# Patient Record
Sex: Female | Born: 1980 | Race: Black or African American | Hispanic: No | Marital: Married
Health system: Southern US, Community
[De-identification: ages and names within clinical notes are randomized; demographics above are authoritative.]

---

## 2020-10-30 DIAGNOSIS — Z419 Encounter for procedure for purposes other than remedying health state, unspecified: Secondary | ICD-10-CM | POA: Diagnosis not present

## 2020-11-30 DIAGNOSIS — Z419 Encounter for procedure for purposes other than remedying health state, unspecified: Secondary | ICD-10-CM | POA: Diagnosis not present

## 2020-12-26 DIAGNOSIS — Z114 Encounter for screening for human immunodeficiency virus [HIV]: Secondary | ICD-10-CM | POA: Diagnosis not present

## 2020-12-26 DIAGNOSIS — Z23 Encounter for immunization: Secondary | ICD-10-CM | POA: Diagnosis not present

## 2020-12-26 DIAGNOSIS — Z049 Encounter for examination and observation for unspecified reason: Secondary | ICD-10-CM | POA: Diagnosis not present

## 2020-12-31 DIAGNOSIS — Z419 Encounter for procedure for purposes other than remedying health state, unspecified: Secondary | ICD-10-CM | POA: Diagnosis not present

## 2021-01-08 DIAGNOSIS — Z13220 Encounter for screening for lipoid disorders: Secondary | ICD-10-CM | POA: Diagnosis not present

## 2021-01-08 DIAGNOSIS — Z130 Encounter for screening for diseases of the blood and blood-forming organs and certain disorders involving the immune mechanism: Secondary | ICD-10-CM | POA: Diagnosis not present

## 2021-01-08 DIAGNOSIS — R221 Localized swelling, mass and lump, neck: Secondary | ICD-10-CM | POA: Diagnosis not present

## 2021-01-08 DIAGNOSIS — Z0189 Encounter for other specified special examinations: Secondary | ICD-10-CM | POA: Diagnosis not present

## 2021-01-08 DIAGNOSIS — Z1329 Encounter for screening for other suspected endocrine disorder: Secondary | ICD-10-CM | POA: Diagnosis not present

## 2021-01-08 DIAGNOSIS — Z131 Encounter for screening for diabetes mellitus: Secondary | ICD-10-CM | POA: Diagnosis not present

## 2021-01-15 ENCOUNTER — Other Ambulatory Visit: Payer: Self-pay | Admitting: Family Medicine

## 2021-01-15 DIAGNOSIS — R221 Localized swelling, mass and lump, neck: Secondary | ICD-10-CM

## 2021-01-27 ENCOUNTER — Ambulatory Visit (HOSPITAL_COMMUNITY)
Admission: RE | Admit: 2021-01-27 | Discharge: 2021-01-27 | Disposition: A | Discharge status: Home / Self Care | Payer: Medicaid Other | Source: Ambulatory Visit | Attending: Family Medicine | Admitting: Family Medicine

## 2021-01-27 ENCOUNTER — Other Ambulatory Visit: Payer: Self-pay

## 2021-01-27 DIAGNOSIS — R221 Localized swelling, mass and lump, neck: Secondary | ICD-10-CM | POA: Insufficient documentation

## 2021-01-27 DIAGNOSIS — R220 Localized swelling, mass and lump, head: Secondary | ICD-10-CM | POA: Diagnosis not present

## 2021-01-27 DIAGNOSIS — E041 Nontoxic single thyroid nodule: Secondary | ICD-10-CM | POA: Diagnosis not present

## 2021-01-28 DIAGNOSIS — Z419 Encounter for procedure for purposes other than remedying health state, unspecified: Secondary | ICD-10-CM | POA: Diagnosis not present

## 2021-01-29 ENCOUNTER — Other Ambulatory Visit: Payer: Self-pay

## 2021-01-29 ENCOUNTER — Other Ambulatory Visit: Payer: Self-pay | Admitting: Family Medicine

## 2021-01-29 DIAGNOSIS — R221 Localized swelling, mass and lump, neck: Secondary | ICD-10-CM

## 2021-01-30 ENCOUNTER — Other Ambulatory Visit: Payer: Self-pay | Admitting: Family Medicine

## 2021-01-30 DIAGNOSIS — R221 Localized swelling, mass and lump, neck: Secondary | ICD-10-CM

## 2021-02-11 DIAGNOSIS — Z655 Exposure to disaster, war and other hostilities: Secondary | ICD-10-CM | POA: Diagnosis not present

## 2021-02-11 DIAGNOSIS — Z01419 Encounter for gynecological examination (general) (routine) without abnormal findings: Secondary | ICD-10-CM | POA: Diagnosis not present

## 2021-02-11 DIAGNOSIS — Z0000 Encounter for general adult medical examination without abnormal findings: Secondary | ICD-10-CM | POA: Diagnosis not present

## 2021-02-11 DIAGNOSIS — Z111 Encounter for screening for respiratory tuberculosis: Secondary | ICD-10-CM | POA: Diagnosis not present

## 2021-02-11 DIAGNOSIS — Z124 Encounter for screening for malignant neoplasm of cervix: Secondary | ICD-10-CM | POA: Diagnosis not present

## 2021-02-11 DIAGNOSIS — R221 Localized swelling, mass and lump, neck: Secondary | ICD-10-CM | POA: Diagnosis not present

## 2021-02-14 ENCOUNTER — Inpatient Hospital Stay: Admission: RE | Admit: 2021-02-14 | Payer: Medicaid Other | Source: Ambulatory Visit

## 2021-02-14 ENCOUNTER — Other Ambulatory Visit: Payer: Self-pay

## 2021-02-25 DIAGNOSIS — Z7689 Persons encountering health services in other specified circumstances: Secondary | ICD-10-CM | POA: Diagnosis not present

## 2021-02-28 DIAGNOSIS — Z419 Encounter for procedure for purposes other than remedying health state, unspecified: Secondary | ICD-10-CM | POA: Diagnosis not present

## 2021-03-03 ENCOUNTER — Inpatient Hospital Stay: Admission: RE | Admit: 2021-03-03 | Payer: Medicaid Other | Source: Ambulatory Visit

## 2021-03-03 DIAGNOSIS — Z7689 Persons encountering health services in other specified circumstances: Secondary | ICD-10-CM | POA: Diagnosis not present

## 2021-03-17 ENCOUNTER — Inpatient Hospital Stay: Admission: RE | Admit: 2021-03-17 | Payer: Medicaid Other | Source: Ambulatory Visit

## 2021-03-30 DIAGNOSIS — Z419 Encounter for procedure for purposes other than remedying health state, unspecified: Secondary | ICD-10-CM | POA: Diagnosis not present

## 2021-04-03 ENCOUNTER — Other Ambulatory Visit: Payer: Self-pay

## 2021-04-03 ENCOUNTER — Ambulatory Visit
Admission: RE | Admit: 2021-04-03 | Discharge: 2021-04-03 | Disposition: A | Discharge status: Home / Self Care | Payer: No Typology Code available for payment source | Source: Ambulatory Visit | Attending: Obstetrics and Gynecology | Admitting: Obstetrics and Gynecology

## 2021-04-03 ENCOUNTER — Other Ambulatory Visit: Payer: Self-pay | Admitting: Obstetrics and Gynecology

## 2021-04-03 DIAGNOSIS — Z0000 Encounter for general adult medical examination without abnormal findings: Secondary | ICD-10-CM

## 2021-04-30 DIAGNOSIS — Z419 Encounter for procedure for purposes other than remedying health state, unspecified: Secondary | ICD-10-CM | POA: Diagnosis not present

## 2021-05-26 DIAGNOSIS — R221 Localized swelling, mass and lump, neck: Secondary | ICD-10-CM | POA: Diagnosis not present

## 2021-05-26 DIAGNOSIS — E049 Nontoxic goiter, unspecified: Secondary | ICD-10-CM | POA: Diagnosis not present

## 2021-05-26 DIAGNOSIS — R590 Localized enlarged lymph nodes: Secondary | ICD-10-CM | POA: Diagnosis not present

## 2021-05-30 DIAGNOSIS — Z419 Encounter for procedure for purposes other than remedying health state, unspecified: Secondary | ICD-10-CM | POA: Diagnosis not present

## 2021-06-03 DIAGNOSIS — E049 Nontoxic goiter, unspecified: Secondary | ICD-10-CM | POA: Diagnosis not present

## 2021-06-03 DIAGNOSIS — R590 Localized enlarged lymph nodes: Secondary | ICD-10-CM | POA: Diagnosis not present

## 2021-06-26 DIAGNOSIS — R590 Localized enlarged lymph nodes: Secondary | ICD-10-CM | POA: Diagnosis not present

## 2021-06-30 DIAGNOSIS — Z419 Encounter for procedure for purposes other than remedying health state, unspecified: Secondary | ICD-10-CM | POA: Diagnosis not present

## 2021-07-31 DIAGNOSIS — Z419 Encounter for procedure for purposes other than remedying health state, unspecified: Secondary | ICD-10-CM | POA: Diagnosis not present

## 2021-08-30 DIAGNOSIS — Z419 Encounter for procedure for purposes other than remedying health state, unspecified: Secondary | ICD-10-CM | POA: Diagnosis not present

## 2021-09-30 DIAGNOSIS — Z419 Encounter for procedure for purposes other than remedying health state, unspecified: Secondary | ICD-10-CM | POA: Diagnosis not present

## 2021-10-02 DIAGNOSIS — R590 Localized enlarged lymph nodes: Secondary | ICD-10-CM | POA: Diagnosis not present

## 2021-10-03 DIAGNOSIS — Z23 Encounter for immunization: Secondary | ICD-10-CM | POA: Diagnosis not present

## 2021-10-30 DIAGNOSIS — Z419 Encounter for procedure for purposes other than remedying health state, unspecified: Secondary | ICD-10-CM | POA: Diagnosis not present

## 2021-11-30 DIAGNOSIS — Z419 Encounter for procedure for purposes other than remedying health state, unspecified: Secondary | ICD-10-CM | POA: Diagnosis not present

## 2021-12-31 DIAGNOSIS — Z419 Encounter for procedure for purposes other than remedying health state, unspecified: Secondary | ICD-10-CM | POA: Diagnosis not present

## 2022-01-28 DIAGNOSIS — Z419 Encounter for procedure for purposes other than remedying health state, unspecified: Secondary | ICD-10-CM | POA: Diagnosis not present

## 2022-02-28 DIAGNOSIS — Z419 Encounter for procedure for purposes other than remedying health state, unspecified: Secondary | ICD-10-CM | POA: Diagnosis not present

## 2022-03-30 DIAGNOSIS — Z419 Encounter for procedure for purposes other than remedying health state, unspecified: Secondary | ICD-10-CM | POA: Diagnosis not present

## 2022-04-26 ENCOUNTER — Emergency Department (HOSPITAL_COMMUNITY)
Admission: EM | Admit: 2022-04-26 | Discharge: 2022-04-26 | Disposition: A | Discharge status: Home / Self Care | Payer: Medicaid Other | Attending: Emergency Medicine | Admitting: Emergency Medicine

## 2022-04-26 ENCOUNTER — Encounter (HOSPITAL_COMMUNITY): Payer: Self-pay | Admitting: Emergency Medicine

## 2022-04-26 DIAGNOSIS — R109 Unspecified abdominal pain: Secondary | ICD-10-CM | POA: Diagnosis present

## 2022-04-26 DIAGNOSIS — J020 Streptococcal pharyngitis: Secondary | ICD-10-CM | POA: Diagnosis not present

## 2022-04-26 DIAGNOSIS — N390 Urinary tract infection, site not specified: Secondary | ICD-10-CM | POA: Insufficient documentation

## 2022-04-26 DIAGNOSIS — E0590 Thyrotoxicosis, unspecified without thyrotoxic crisis or storm: Secondary | ICD-10-CM | POA: Diagnosis not present

## 2022-04-26 LAB — CBC WITH DIFFERENTIAL/PLATELET
Abs Immature Granulocytes: 0.01 10*3/uL (ref 0.00–0.07)
Basophils Absolute: 0 10*3/uL (ref 0.0–0.1)
Basophils Relative: 0 %
Eosinophils Absolute: 0.1 10*3/uL (ref 0.0–0.5)
Eosinophils Relative: 1 %
HCT: 34.4 % — ABNORMAL LOW (ref 36.0–46.0)
Hemoglobin: 10.9 g/dL — ABNORMAL LOW (ref 12.0–15.0)
Immature Granulocytes: 0 %
Lymphocytes Relative: 17 %
Lymphs Abs: 0.8 10*3/uL (ref 0.7–4.0)
MCH: 26.7 pg (ref 26.0–34.0)
MCHC: 31.7 g/dL (ref 30.0–36.0)
MCV: 84.1 fL (ref 80.0–100.0)
Monocytes Absolute: 0.7 10*3/uL (ref 0.1–1.0)
Monocytes Relative: 16 %
Neutro Abs: 3 10*3/uL (ref 1.7–7.7)
Neutrophils Relative %: 66 %
Platelets: 228 10*3/uL (ref 150–400)
RBC: 4.09 MIL/uL (ref 3.87–5.11)
RDW: 13.1 % (ref 11.5–15.5)
WBC: 4.5 10*3/uL (ref 4.0–10.5)
nRBC: 0 % (ref 0.0–0.2)

## 2022-04-26 LAB — COMPREHENSIVE METABOLIC PANEL
ALT: 12 U/L (ref 0–44)
AST: 20 U/L (ref 15–41)
Albumin: 3.2 g/dL — ABNORMAL LOW (ref 3.5–5.0)
Alkaline Phosphatase: 57 U/L (ref 38–126)
Anion gap: 6 (ref 5–15)
BUN: 6 mg/dL (ref 6–20)
CO2: 27 mmol/L (ref 22–32)
Calcium: 8.9 mg/dL (ref 8.9–10.3)
Chloride: 106 mmol/L (ref 98–111)
Creatinine, Ser: 0.59 mg/dL (ref 0.44–1.00)
GFR, Estimated: >60 mL/min (ref >60)
Glucose, Bld: 90 mg/dL (ref 70–99)
Potassium: 3.8 mmol/L (ref 3.5–5.1)
Sodium: 139 mmol/L (ref 135–145)
Total Bilirubin: 0.4 mg/dL (ref 0.3–1.2)
Total Protein: 6.6 g/dL (ref 6.5–8.1)

## 2022-04-26 LAB — URINALYSIS, ROUTINE W REFLEX MICROSCOPIC
Bilirubin Urine: NEGATIVE
Glucose, UA: NEGATIVE mg/dL
Ketones, ur: NEGATIVE mg/dL
Nitrite: NEGATIVE
Protein, ur: NEGATIVE mg/dL
Specific Gravity, Urine: 1.005 (ref 1.005–1.030)
pH: 7 (ref 5.0–8.0)

## 2022-04-26 LAB — POC URINE PREG, ED: Preg Test, Ur: NEGATIVE

## 2022-04-26 LAB — TSH: TSH: 0.237 u[IU]/mL — ABNORMAL LOW (ref 0.350–4.500)

## 2022-04-26 LAB — GROUP A STREP BY PCR: Group A Strep by PCR: DETECTED — AB

## 2022-04-26 LAB — LIPASE, BLOOD: Lipase: 35 U/L (ref 11–51)

## 2022-04-26 MED ORDER — AMOXICILLIN 500 MG PO CAPS: 500.0000 mg | ORAL_CAPSULE | Freq: Two times a day (BID) | ORAL | 0 refills | Status: AC

## 2022-04-26 NOTE — ED Provider Notes (Signed)
[facility] DEPART[facility] Provider Note   CSN: [id number] Arrival date & time: 04/26/22  1007     History  Chief Complaint  Patient presents with   Abdominal Pain    Cynthia Levy is a 41 y.o. female.  41 year old female with no significant past medical history presents with complaint of sore throat and abdominal pain.  The symptoms have been ongoing for several months however felt like she was feverish at work yesterday (temperature not checked, states it was a warm room and she needed to wear a jacket at her coworkers all told her that she felt hot to the touch).  She denies changes in bowel or bladder habits, nausea, vomiting.  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  Sore Throat      Home Medications Prior to Admission medications   Medication Sig Start Date End Date Taking? Authorizing Provider  amoxicillin (AMOXIL) 500 MG capsule Take 1 capsule (500 mg total) by mouth 2 (two) times daily for 7 days. 04/26/22 05/03/22 Yes Tacy Learn, PA-C      Allergies    Patient has no known allergies.    Review of Systems   Review of Systems Negative except as per HPI Physical Exam Updated Vital Signs BP 128/89   Pulse 61   Temp 98.7 F (37.1 C) (Oral)   Resp 18   LMP  (Within Weeks)   SpO2 100%  Physical Exam Vitals and nursing note reviewed.  Constitutional:      General: She is not in acute distress.    Appearance: She is well-developed. She is not diaphoretic.  HENT:     Head: Normocephalic and atraumatic.     Right Ear: Tympanic membrane and ear canal normal.     Left Ear: Tympanic membrane and ear canal normal.     Nose: No congestion or rhinorrhea.     Mouth/Throat:     Mouth: Mucous membranes are moist. No oral lesions.     Pharynx: Oropharynx is clear. Uvula midline. Posterior oropharyngeal erythema present. No pharyngeal swelling, oropharyngeal exudate or uvula swelling.     Tonsils: No tonsillar exudate or tonsillar  abscesses. 1+ on the right. 1+ on the left.  Neck:     Thyroid: Thyromegaly present.  Cardiovascular:     Rate and Rhythm: Normal rate and regular rhythm.     Heart sounds: Normal heart sounds.  Pulmonary:     Effort: Pulmonary effort is normal.  Abdominal:     Palpations: Abdomen is soft.     Tenderness: There is abdominal tenderness in the right lower quadrant, suprapubic area and left lower quadrant.  Skin:    General: Skin is warm and dry.  Neurological:     Mental Status: She is alert and oriented to person, place, and time.  Psychiatric:        Behavior: Behavior normal.    ED Results / Procedures / Treatments   Labs (all labs ordered are listed, but only abnormal results are displayed) Labs Reviewed  GROUP A STREP BY PCR - Abnormal; Notable for the following components:      Result Value   Group A Strep by PCR DETECTED (*)    All other components within normal limits  CBC WITH DIFFERENTIAL/PLATELET - Abnormal; Notable for the following components:   Hemoglobin 10.9 (*)    HCT 34.4 (*)    All other components within normal limits  COMPREHENSIVE METABOLIC PANEL - Abnormal; Notable for the following components:  Albumin 3.2 (*)    All other components within normal limits  URINALYSIS, ROUTINE W REFLEX MICROSCOPIC - Abnormal; Notable for the following components:   Color, Urine STRAW (*)    APPearance HAZY (*)    Hgb urine dipstick SMALL (*)    Leukocytes,Ua MODERATE (*)    Bacteria, UA RARE (*)    All other components within normal limits  TSH - Abnormal; Notable for the following components:   TSH 0.237 (*)    All other components within normal limits  LIPASE, BLOOD  I-STAT BETA HCG BLOOD, ED ([facility], [facility], [facility] ONLY)  POC URINE PREG, ED    EKG None  Radiology No results found.  Procedures Procedures    Medications Ordered in ED Medications - No data to display  ED Course/ Medical Decision Making/ A&P                           Medical Decision  Making Amount and/or Complexity of Data Reviewed Labs: ordered.  Risk Prescription drug management.   This patient presents to the ED for concern of sore throat, lower abdominal pain, this involves an extensive number of treatment options, and is a complaint that carries with it a high risk of complications and morbidity.  The differential diagnosis includes but not limited to viral illness, strep throat, thyroid disorder, UTI, PID, ectopic, TOA, appendicitis   Co morbidities that complicate the patient evaluation  Goiter   Additional history obtained:  External records from outside source obtained and reviewed including limited records on file, appears lost to folowup in work up for goiter and cervical Lns om 05/2021   Lab Tests:  I Ordered, and personally interpreted labs.  The pertinent results include: TSH low at 0.237, recommend additional work-up with PCP.  hCG negative.  Lipase normal.  CBC with hemoglobin of 10.9, no prior for comparison.  CMP without significant findings.  Urinalysis positive for hemoglobin with moderate leukocytes and rare bacteria.  Group A strep swab positive  Problem List / ED Course / Critical interventions / Medication management  41 year old female with complaint of sore throat found to be strep positive, treated with amoxicillin.  Also with complaint of lower abdominal pain, possible UTI, will cover with same.  Her TSH is low.  She was given a copy of her results and advised to follow-up with her primary care provider. I have reviewed the patients home medicines and have made adjustments as needed   Social Determinants of Health:  Has PCP for follow-up care, limited English   Test / Admission - Considered:  Consider further thyroid testing which can be completed outpatient         Final Clinical Impression(s) / ED Diagnoses Final diagnoses:  Strep throat  Urinary tract infection in female  Hyperthyroidism determined by thyroid  function test    Rx / DC Orders ED Discharge Orders          Ordered    amoxicillin (AMOXIL) 500 MG capsule  2 times daily        04/26/22 1301              Tacy Learn, PA-C 04/26/22 1446    Noemi Chapel, MD 04/28/22 [phone]

## 2022-04-26 NOTE — Discharge Instructions (Addendum)
Your throat swab is positive for strep. Take your antibiotic as prescribed and complete the full course.  Your abdominal pain may be caused by infection in your urine. This is treated with the same antibiotic you are taking for your throat infection.  Recheck with your doctor if not feeling better after finishing your medicine. Return to the ER for any severe or concerning symptoms.   Your thyroid (a gland in your neck) test was low. Take your test results to your doctor for further workup.     Umuhogo wawe wo mu muhogo ni mwiza ku murongo. Fata antibiyotike yawe nkuko byateganijwe hanyuma urangize amasomo yose. Ububabare bwo munda bushobora guterwa no kwandura inkari zawe. Ibi bivurwa hamwe na antibiotique imwe ufata kugirango wandure umuhogo.  [place] usuzume na muganga wawe niba utameze neza nyuma yo [place] imiti. [facility] kubintu byose bikomeye cyangwa bijyanye nibimenyetso.  Tiroyide yawe (gland mu ijosi) ikizamini cyari gito. Fata ibisubizo byawe kwa muganga kugirango ukore imyitozo.

## 2022-04-26 NOTE — ED Triage Notes (Signed)
Patient complains of sore throat and abdominal pain that started some time ago but got worse yesterday. Patient also reports irregular bowel movements recently.

## 2022-04-30 DIAGNOSIS — Z419 Encounter for procedure for purposes other than remedying health state, unspecified: Secondary | ICD-10-CM | POA: Diagnosis not present

## 2022-05-30 DIAGNOSIS — Z419 Encounter for procedure for purposes other than remedying health state, unspecified: Secondary | ICD-10-CM | POA: Diagnosis not present

## 2022-06-30 DIAGNOSIS — Z419 Encounter for procedure for purposes other than remedying health state, unspecified: Secondary | ICD-10-CM | POA: Diagnosis not present

## 2022-07-31 DIAGNOSIS — Z419 Encounter for procedure for purposes other than remedying health state, unspecified: Secondary | ICD-10-CM | POA: Diagnosis not present

## 2022-08-30 DIAGNOSIS — Z419 Encounter for procedure for purposes other than remedying health state, unspecified: Secondary | ICD-10-CM | POA: Diagnosis not present

## 2022-09-30 DIAGNOSIS — Z419 Encounter for procedure for purposes other than remedying health state, unspecified: Secondary | ICD-10-CM | POA: Diagnosis not present

## 2022-10-30 DIAGNOSIS — Z419 Encounter for procedure for purposes other than remedying health state, unspecified: Secondary | ICD-10-CM | POA: Diagnosis not present

## 2022-11-30 DIAGNOSIS — Z419 Encounter for procedure for purposes other than remedying health state, unspecified: Secondary | ICD-10-CM | POA: Diagnosis not present

## 2022-12-20 IMAGING — US US THYROID
1 series · 13 of 25 positions shown · non-contrast
Comparison: None.

CLINICAL DATA: Left neck lump for 2 years

EXAM:
THYROID ULTRASOUND
TECHNIQUE: Ultrasound examination of the thyroid gland and adjacent soft
tissues was performed.

[Series 1: us thyroid · 37 acquisitions, 13 frames shown]
[im 1/37]
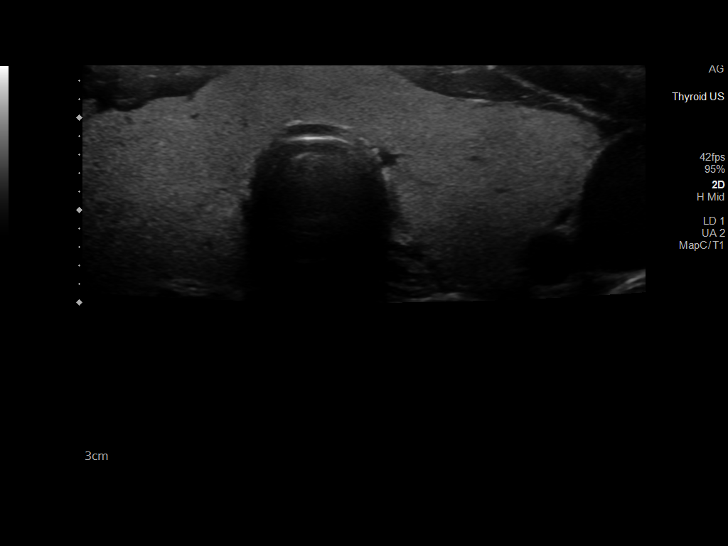
[im 4/37]
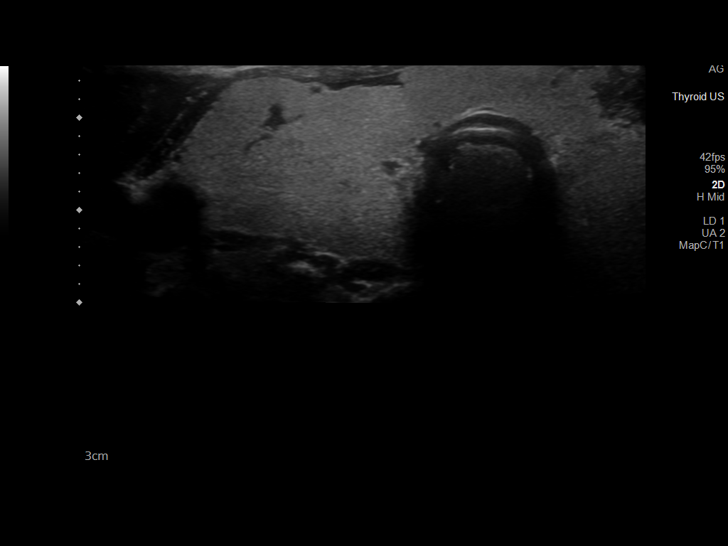
[im 7/37]
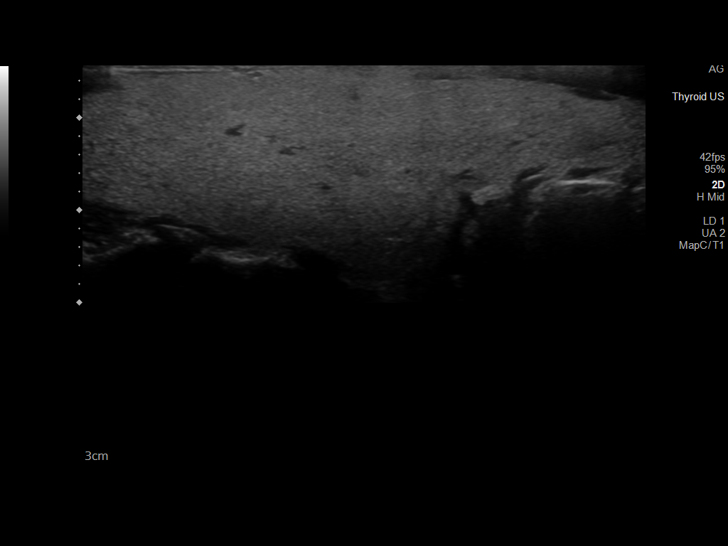
[im 10/37]
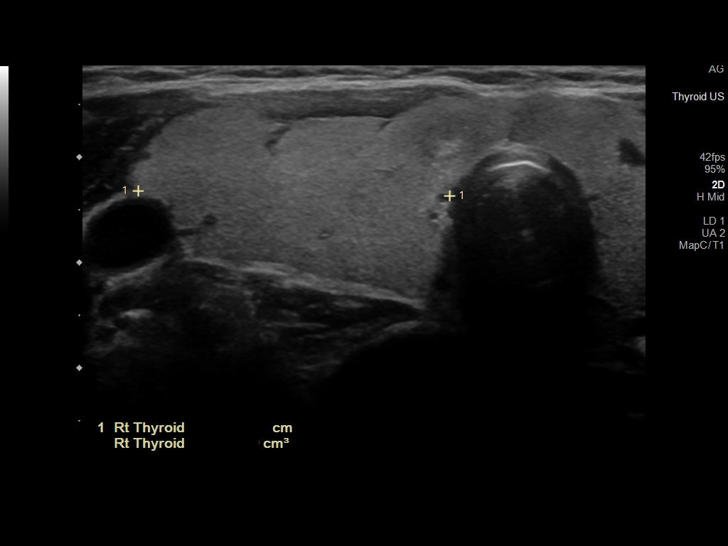
[im 13/37]
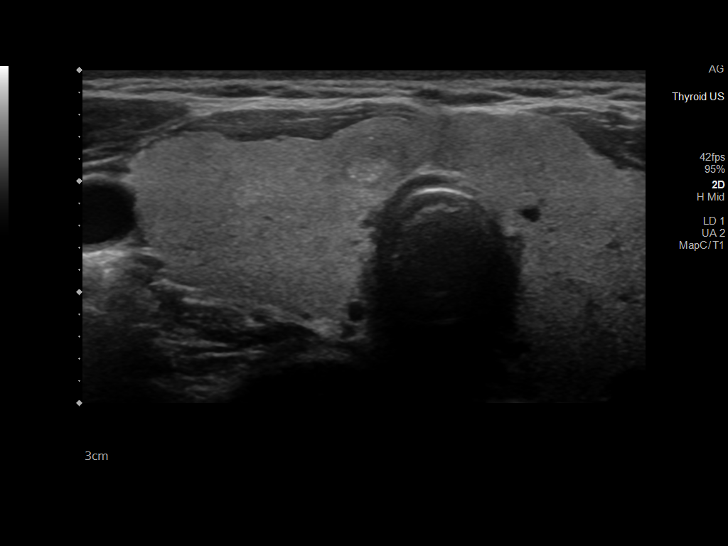
[im 16/37]
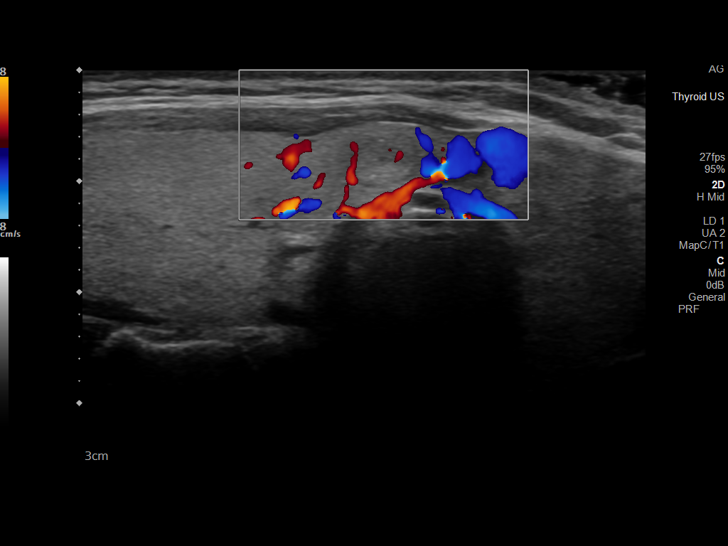
[im 19/37]
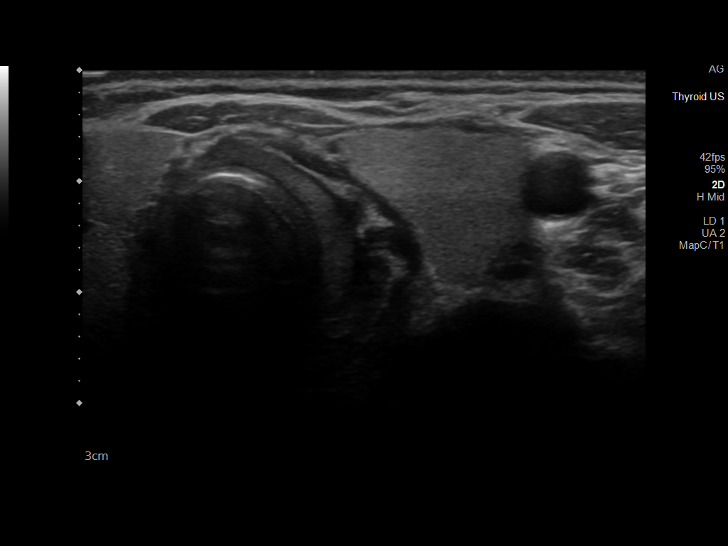
[im 22/37]
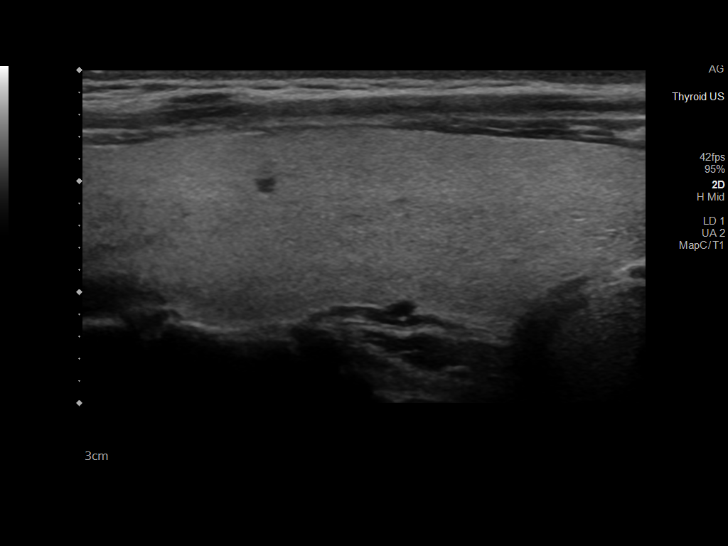
[im 25/37]
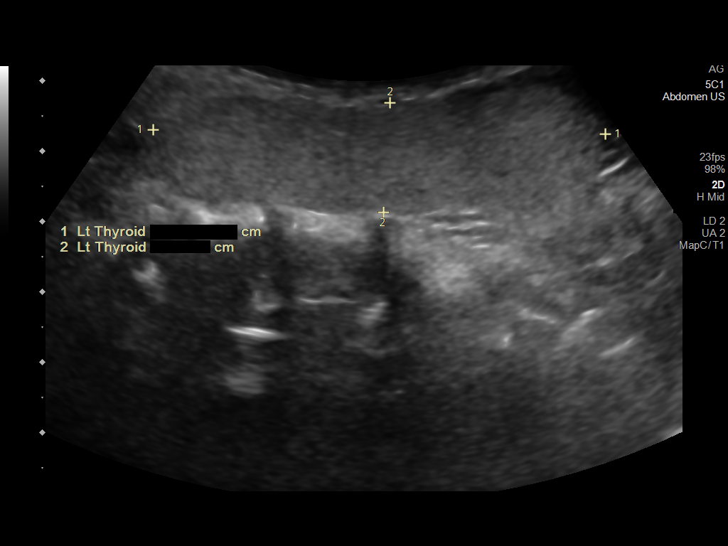
[im 28/37]
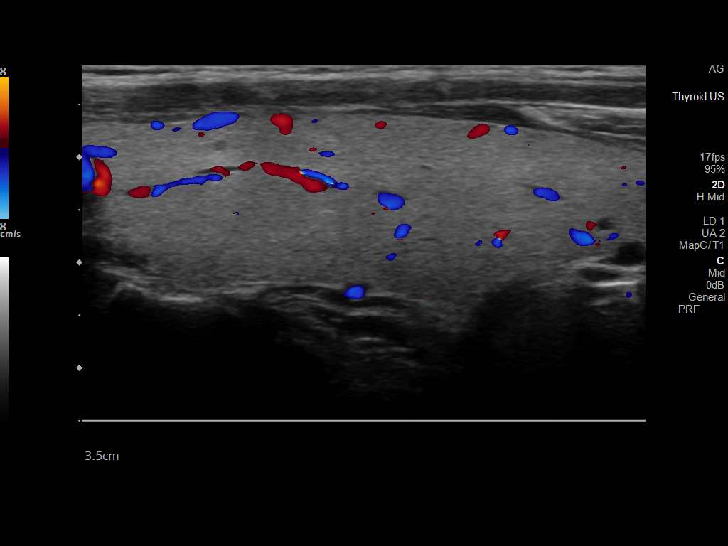
[im 31/37]
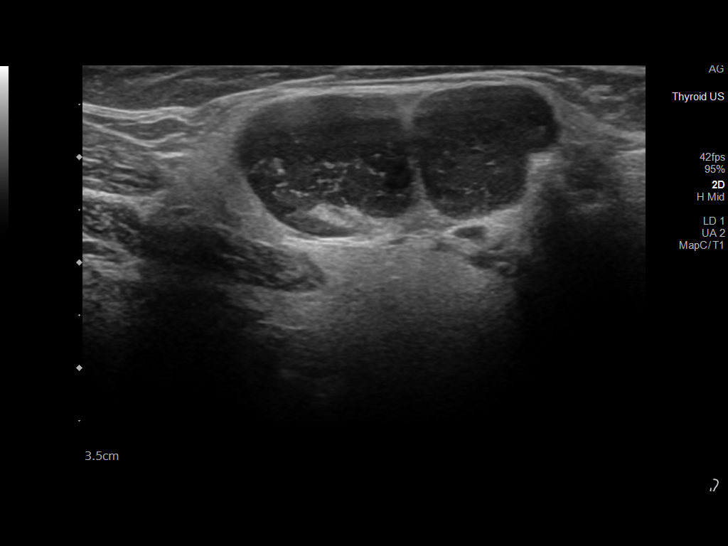
[im 34/37]
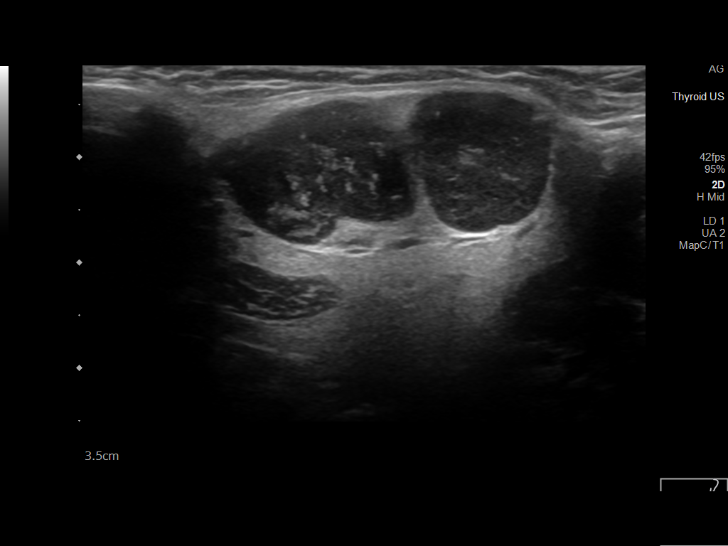
[im 37/37]
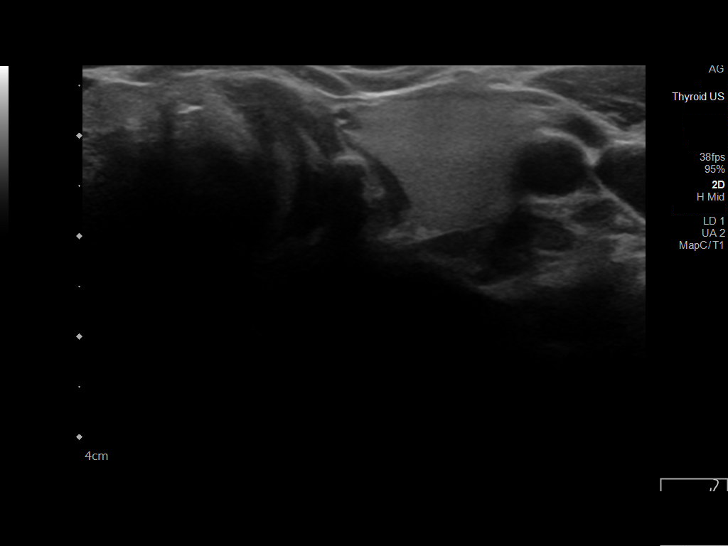

[13 of 25 positions shown; findings below may reference images not displayed]

FINDINGS: Parenchymal Echotexture: Normal

Isthmus: 0.7 cm

Right lobe: 6.8 x 2.0 x 3.0 cm

Left lobe: 6.4 x 1.6 x 2.4 cm

_________________________________________________________

Estimated total number of nodules >/= 1 cm: 0

Number of spongiform nodules >/=  2 cm not described below (TR1): 0

Number of mixed cystic and solid nodules >/= 1.5 cm not described
below (TR2): 0

_________________________________________________________

Nodule # 1:

Location: Isthmus; inferior

Maximum size: 0.6 cm; Other 2 dimensions: 0.5 x 0.5 cm

Composition: solid/almost completely solid (2)

Echogenicity: isoechoic (1)

Shape: not taller-than-wide (0)

Margins: smooth (0)

Echogenic foci: punctate echogenic foci (3)

ACR TI-RADS total points: 6.

ACR TI-RADS risk category: TR4 (4-6 points).

ACR TI-RADS recommendations:

Given size (<0.9 cm) and appearance, this nodule does NOT meet
TI-RADS criteria for biopsy or dedicated follow-up.

_________________________________________________________

Targeted sonographic evaluation of the left submandibular region
demonstrates an ovoid hypoechoic mass measuring 3.2 x 3.1 x 1.4 cm.
This is suspicious for abnormally enlarged lymph node.
IMPRESSION: 1. Solitary 0.6 cm thyroid isthmus nodule does not meet criteria for
FNA or imaging follow-up.
2. 3.2 x 3.1 x 1.4 cm hypoechoic mass in the left submandibular
region corresponds to the patient's area of palpable finding. This
is suspicious for an abnormally enlarged lymph node. Further
evaluation with contrast enhanced soft tissue neck CT should be
performed.

These results will be called to the ordering clinician or
representative by the Radiologist Assistant, and communication
documented in the PACS or [REDACTED].

The above is in keeping with the ACR TI-RADS recommendations - [HOSPITAL] 9621;[DATE].

## 2022-12-31 DIAGNOSIS — Z419 Encounter for procedure for purposes other than remedying health state, unspecified: Secondary | ICD-10-CM | POA: Diagnosis not present

## 2023-01-29 DIAGNOSIS — Z419 Encounter for procedure for purposes other than remedying health state, unspecified: Secondary | ICD-10-CM | POA: Diagnosis not present

## 2023-03-01 DIAGNOSIS — Z419 Encounter for procedure for purposes other than remedying health state, unspecified: Secondary | ICD-10-CM | POA: Diagnosis not present

## 2023-03-31 DIAGNOSIS — Z419 Encounter for procedure for purposes other than remedying health state, unspecified: Secondary | ICD-10-CM | POA: Diagnosis not present

## 2023-05-01 DIAGNOSIS — Z419 Encounter for procedure for purposes other than remedying health state, unspecified: Secondary | ICD-10-CM | POA: Diagnosis not present

## 2023-05-31 DIAGNOSIS — Z419 Encounter for procedure for purposes other than remedying health state, unspecified: Secondary | ICD-10-CM | POA: Diagnosis not present

## 2023-07-01 DIAGNOSIS — Z419 Encounter for procedure for purposes other than remedying health state, unspecified: Secondary | ICD-10-CM | POA: Diagnosis not present

## 2023-08-01 DIAGNOSIS — Z419 Encounter for procedure for purposes other than remedying health state, unspecified: Secondary | ICD-10-CM | POA: Diagnosis not present

## 2023-08-31 DIAGNOSIS — Z419 Encounter for procedure for purposes other than remedying health state, unspecified: Secondary | ICD-10-CM | POA: Diagnosis not present

## 2023-10-01 DIAGNOSIS — Z419 Encounter for procedure for purposes other than remedying health state, unspecified: Secondary | ICD-10-CM | POA: Diagnosis not present

## 2023-10-31 DIAGNOSIS — Z419 Encounter for procedure for purposes other than remedying health state, unspecified: Secondary | ICD-10-CM | POA: Diagnosis not present

## 2023-12-01 DIAGNOSIS — Z419 Encounter for procedure for purposes other than remedying health state, unspecified: Secondary | ICD-10-CM | POA: Diagnosis not present

## 2024-01-01 DIAGNOSIS — Z419 Encounter for procedure for purposes other than remedying health state, unspecified: Secondary | ICD-10-CM | POA: Diagnosis not present

## 2024-01-29 DIAGNOSIS — Z419 Encounter for procedure for purposes other than remedying health state, unspecified: Secondary | ICD-10-CM | POA: Diagnosis not present

## 2024-03-11 DIAGNOSIS — Z419 Encounter for procedure for purposes other than remedying health state, unspecified: Secondary | ICD-10-CM | POA: Diagnosis not present

## 2024-04-10 DIAGNOSIS — Z419 Encounter for procedure for purposes other than remedying health state, unspecified: Secondary | ICD-10-CM | POA: Diagnosis not present

## 2024-05-11 DIAGNOSIS — Z419 Encounter for procedure for purposes other than remedying health state, unspecified: Secondary | ICD-10-CM | POA: Diagnosis not present

## 2024-06-10 DIAGNOSIS — Z419 Encounter for procedure for purposes other than remedying health state, unspecified: Secondary | ICD-10-CM | POA: Diagnosis not present

## 2024-07-11 DIAGNOSIS — Z419 Encounter for procedure for purposes other than remedying health state, unspecified: Secondary | ICD-10-CM | POA: Diagnosis not present

## 2024-08-11 DIAGNOSIS — Z419 Encounter for procedure for purposes other than remedying health state, unspecified: Secondary | ICD-10-CM | POA: Diagnosis not present
